# Patient Record
Sex: Female | Born: 1985 | Race: Black or African American | Hispanic: No | Smoking: Never smoker
Health system: Southern US, Community
[De-identification: ages and names within clinical notes are randomized; demographics above are authoritative.]

## PROBLEM LIST (undated history)

## (undated) DIAGNOSIS — G40909 Epilepsy, unspecified, not intractable, without status epilepticus: Secondary | ICD-10-CM

## (undated) DIAGNOSIS — F319 Bipolar disorder, unspecified: Secondary | ICD-10-CM

## (undated) HISTORY — DX: Epilepsy, unspecified, not intractable, without status epilepticus: G40.909

## (undated) HISTORY — DX: Bipolar disorder, unspecified: F31.9

---
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
## 2008-09-12 ENCOUNTER — Emergency Department: Payer: Self-pay

## 2009-06-22 IMAGING — CR RIGHT ANKLE - COMPLETE 3+ VIEW
1 series · 5 of 5 positions shown · non-contrast
Comparison: none

REASON FOR EXAM: Pain, swelling
COMMENTS:   LMP: Three weeks ago

PROCEDURE:     DXR - DXR ANKLE RIGHT COMPLETE  - September 12, 2008  [DATE]
RESULT:     Soft tissue swelling is appreciated within the medial and
lateral malleolar regions. There does not appear to be evidence of fracture
or dislocation.

[Series 1: view not recorded · 0.17mm/px · 5 of 5 slices shown]
[im 1/5]
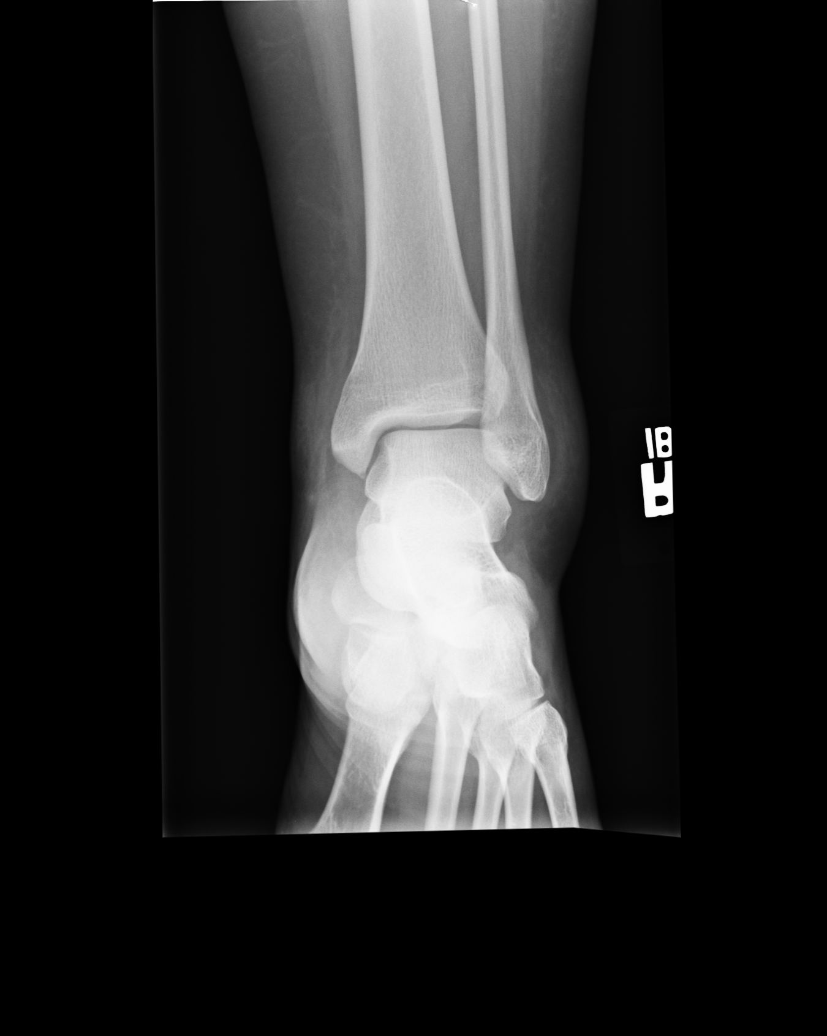
[im 2/5]
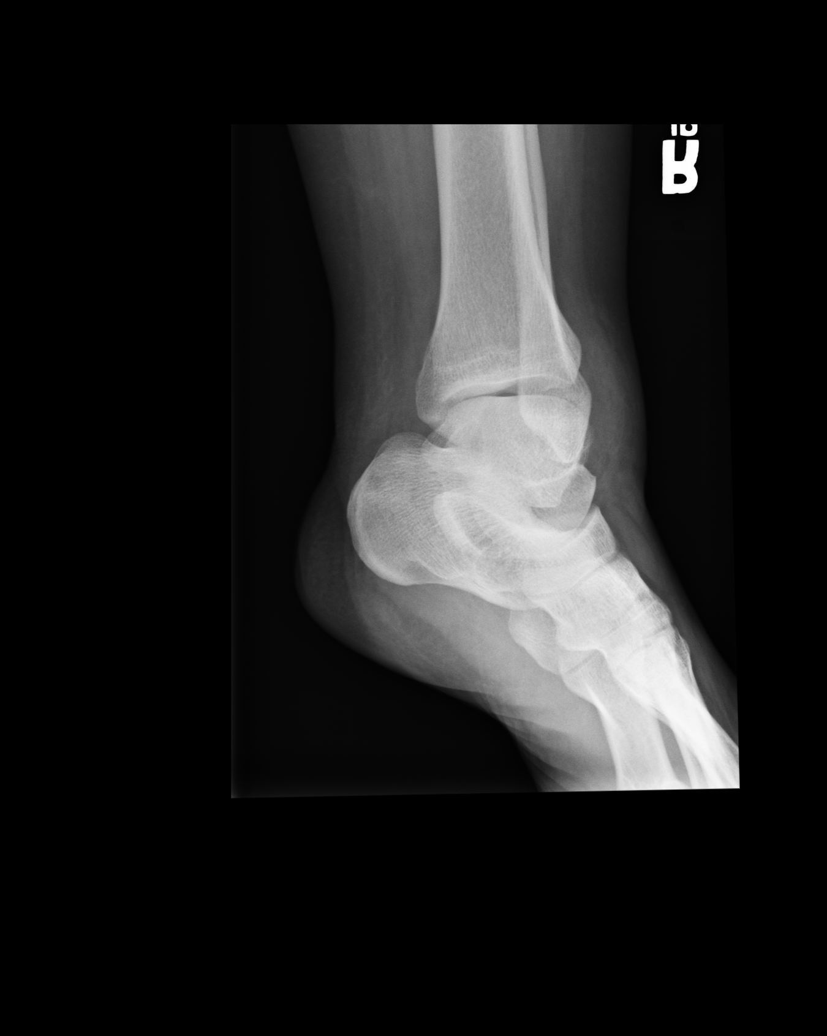
[im 3/5]
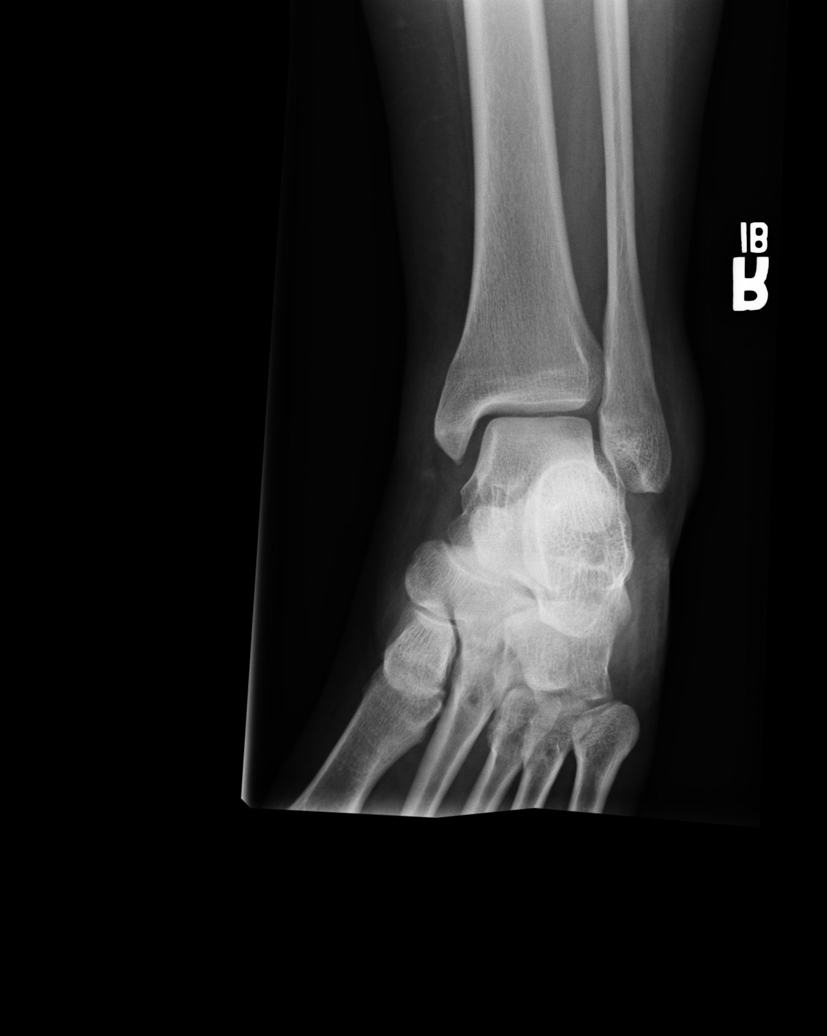
[im 4/5]
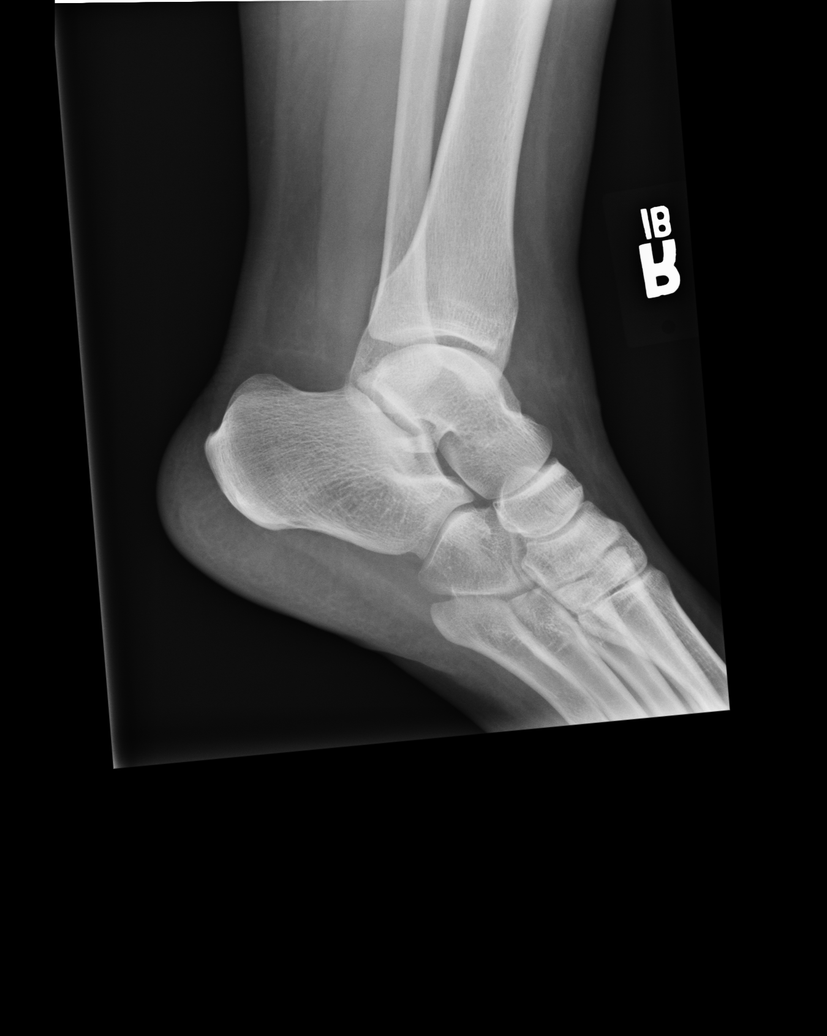
[im 5/5]
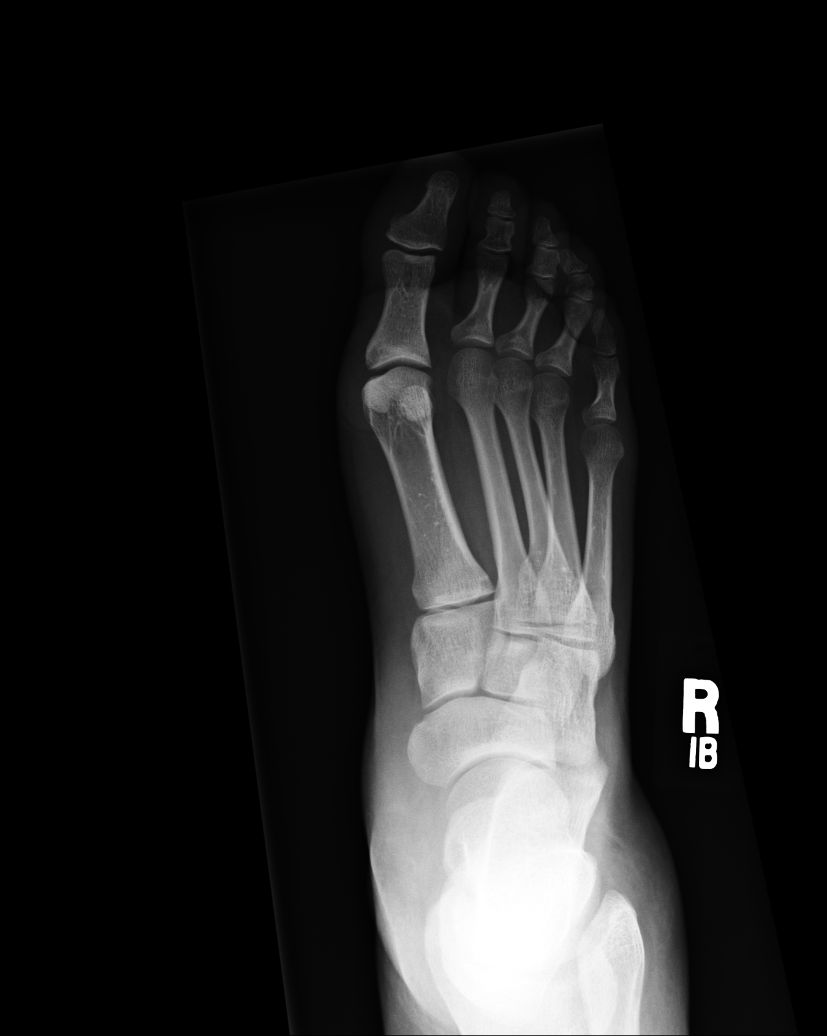

[5 of 5 positions shown; findings below may reference images not displayed]

IMPRESSION: Findings which may reflect the sequelae of ligamentous
injury within the medial and lateral malleolar regions without evidence of
acute osseous abnormalities.

## 2015-07-03 DIAGNOSIS — I10 Essential (primary) hypertension: Secondary | ICD-10-CM | POA: Insufficient documentation

## 2015-07-03 DIAGNOSIS — M0800 Unspecified juvenile rheumatoid arthritis of unspecified site: Secondary | ICD-10-CM | POA: Insufficient documentation

## 2022-04-13 ENCOUNTER — Ambulatory Visit (INDEPENDENT_AMBULATORY_CARE_PROVIDER_SITE_OTHER): Payer: Self-pay | Admitting: Neurology

## 2022-04-13 ENCOUNTER — Other Ambulatory Visit: Payer: Self-pay | Admitting: Neurology

## 2022-04-13 ENCOUNTER — Encounter: Payer: Self-pay | Admitting: Neurology

## 2022-04-13 VITALS — BP 132/84 | HR 86 | Ht 63.6 in | Wt 288.0 lb

## 2022-04-13 DIAGNOSIS — Z5181 Encounter for therapeutic drug level monitoring: Secondary | ICD-10-CM

## 2022-04-13 DIAGNOSIS — G40909 Epilepsy, unspecified, not intractable, without status epilepticus: Secondary | ICD-10-CM

## 2022-04-13 MED ORDER — LEVETIRACETAM ER 1000 MG PO TB24: 1000.0000 mg | ORAL_TABLET | Freq: Every evening | ORAL | 4 refills | Status: DC

## 2022-04-14 ENCOUNTER — Other Ambulatory Visit: Payer: Self-pay | Admitting: Neurology

## 2022-04-15 ENCOUNTER — Telehealth: Payer: Self-pay | Admitting: *Deleted

## 2022-04-15 ENCOUNTER — Encounter: Payer: Self-pay | Admitting: *Deleted

## 2022-04-15 LAB — VITAMIN D 25 HYDROXY (VIT D DEFICIENCY, FRACTURES): Vit D, 25-Hydroxy: 19.2 ng/mL — ABNORMAL LOW (ref 30.0–100.0)

## 2022-04-15 LAB — LEVETIRACETAM LEVEL: Levetiracetam Lvl: <2 ug/mL — ABNORMAL LOW (ref 10.0–40.0)

## 2022-04-15 MED ORDER — LEVETIRACETAM ER 500 MG PO TB24: 1000.0000 mg | ORAL_TABLET | Freq: Every day | ORAL | 11 refills | Status: DC

## 2022-04-15 MED ORDER — VITAMIN D (ERGOCALCIFEROL) 1.25 MG (50000 UNIT) PO CAPS: 50000.0000 [IU] | ORAL_CAPSULE | ORAL | 0 refills | Status: AC

## 2022-04-15 NOTE — Telephone Encounter (Signed)
[facility] does not have levetiracetam ER 1000mg . They need a new rx for levetiracetam ER 500mg , 2 tablets at bedtime. Dr. is out of the office. Dr. will authorize the order in his absence.   I called the patient to let her know. She told me she is currently taking two of the 500mg  ER tablets.

## 2022-04-15 NOTE — Progress Notes (Signed)
Please call and advise the patient that the recent Keppra level was low and to take her Keppra as directed. Also inform patient that Vitamin D level is low and that I will send her a prescription for Vitamin D supplement to take weekly for 8 weeks.  Please remind patient to keep any upcoming appointments or tests and to call us with any interim questions, concerns, problems or updates. Thanks,   Windell Norfolk, MD

## 2022-04-15 NOTE — Telephone Encounter (Addendum)
Pt verified by name and DOB, results given per provider, pt voiced understanding all question answered. 

## 2022-04-15 NOTE — Addendum Note (Signed)
Addended byWindell Norfolk on: 04/15/2022 02:19 PM   Modules accepted: Orders

## 2022-04-15 NOTE — Telephone Encounter (Signed)
-----   Message from Windell Norfolk, MD sent at 04/15/2022  2:18 PM EDT ----- Please call and advise the patient that the recent Keppra level was low and to take her Keppra as directed. Also inform patient that Vitamin D level is low and that I will send her a prescription for Vitamin D supplement to take weekly for 8 weeks.  Please remind patient to keep any upcoming appointments or tests and to call us with any interim questions, concerns, problems or updates. Thanks,   Windell Norfolk, MD

## 2022-05-17 ENCOUNTER — Other Ambulatory Visit: Payer: Self-pay | Admitting: Neurology

## 2022-08-10 ENCOUNTER — Encounter: Payer: Self-pay | Admitting: Neurology

## 2022-10-13 ENCOUNTER — Ambulatory Visit: Payer: Self-pay | Admitting: Neurology

## 2023-02-16 ENCOUNTER — Encounter: Payer: Self-pay | Admitting: Neurology

## 2023-02-16 ENCOUNTER — Ambulatory Visit: Payer: 59 | Admitting: Neurology

## 2023-02-16 VITALS — BP 154/92 | HR 83 | Ht 63.5 in | Wt 319.0 lb

## 2023-02-16 DIAGNOSIS — G40909 Epilepsy, unspecified, not intractable, without status epilepticus: Secondary | ICD-10-CM

## 2023-02-16 MED ORDER — LEVETIRACETAM ER 500 MG PO TB24: 1000.0000 mg | ORAL_TABLET | Freq: Every day | ORAL | 3 refills | Status: DC

## 2023-02-16 NOTE — Patient Instructions (Signed)
Continue with Keppra XR 1000 mg daily Continue your other medications Routine EEG Follow-up with NP  in 1 year or sooner if worse.

## 2023-02-16 NOTE — Progress Notes (Signed)
[facility]  PATIENT: Kristin Villarreal DOB: 1986-01-12  REQUESTING CLINICIAN: No ref. provider found HISTORY FROM: Patient  REASON FOR VISIT: Epilepsy, here to establish care    HISTORICAL  CHIEF COMPLAINT:  Chief Complaint  Patient presents with   Follow-up    Rm13 alone Sz:feb 2022 was the last one, pt wants new EEG   INTERVAL HISTORY 02/16/2023:  Kristin Villarreal presents today for follow-up, she is alone.  Last visit was in June of last year, since then she has been doing well no seizure or seizure-like activity.  She is compliant with the Keppra, denies any side effect but for now a few weeks ago.  Likely for her no breakthrough seizure.  No other complaints  HISTORY OF PRESENT ILLNESS:  This is a 37 year old woman past medical history of epilepsy, bipolar disorder who is presenting to establish care.  Patient reports being diagnosed with epilepsy while in kindergarten, describes her seizures as staring spells.  She was put on Depakote and did well until 2008 when she was put off Depakote because she was seizure-free.  She reported in 2021 she was trying to get her health together, had an EEG and was told that she was still having seizures, she was put back on Keppra, initially started had side effect of somnolence therefore she was switched to extended version.  She reported while being on Keppra she did have 2 subsequent seizures but this was in the setting of alcohol use and possibly forgetting to take her medication.  Her last seizure was reported October 2021.  Denies any side effect of the medication.   She is also on lamotrigine 25 mg daily for bipolar disorder, stated she restarted the medication 2 weeks ago.    Handedness: Right handed   Onset: In Kindergarten   Seizure Type: Staring spell,   Current frequency: Last seizure was October 2021  Any injuries from seizures: None   Seizure risk factors: None   Previous ASMs: Depakote  Currenty ASMs:  Levetiracetam 1000 mg XR   ASMs side effects: Sleepy   Brain Images: Normal head CT 2017  Previous EEGs: Generalized discharges    OTHER MEDICAL CONDITIONS: Bipolar disorder, Epilepsy   REVIEW OF SYSTEMS: Full 14 system review of systems performed and negative with exception of: as noted in the HPI   ALLERGIES: Allergies  Allergen Reactions   Aspirin Nausea And Vomiting, Rash and Shortness Of Breath    Other Reaction(s): GI Intolerance   Penicillins Hives and Rash   Lactose Intolerance (Gi)    Fish Allergy Rash   Fish-Derived Products Rash    HOME MEDICATIONS: Outpatient Medications Prior to Visit  Medication Sig Dispense Refill   lamoTRIgine (LAMICTAL) 25 MG tablet Take 25 mg by mouth daily.     Vitamin D, Ergocalciferol, (DRISDOL) 1.25 MG (50000 UNIT) CAPS capsule Take 1 capsule (50,000 Units total) by mouth every 7 (seven) days. 8 capsule 0   levETIRAcetam (KEPPRA XR) 500 MG 24 hr tablet Take 2 tablets (1,000 mg total) by mouth at bedtime. 60 tablet 11   No facility-administered medications prior to visit.    PAST MEDICAL HISTORY: Past Medical History:  Diagnosis Date   Bipolar 1 disorder (HCC)    Epilepsy (HCC)     PAST SURGICAL HISTORY: History reviewed. No pertinent surgical history.  FAMILY HISTORY: Family History  Problem Relation Age of Onset   Hypertension Mother    Breast cancer Maternal Grandmother    Diabetes Maternal Grandmother  Diabetes Maternal Grandfather    Hypertension Maternal Grandfather     SOCIAL HISTORY: Social History   Socioeconomic History   Marital status: Single    Spouse name: Not on file   Number of children: Not on file   Years of education: Not on file   Highest education level: Not on file  Occupational History   Not on file  Tobacco Use   Smoking status: Never   Smokeless tobacco: Never  Substance and Sexual Activity   Alcohol use: Yes    Comment: socially   Drug use: Not Currently   Sexual activity: Not on  file  Other Topics Concern   Not on file  Social History Narrative   Not on file   Social Determinants of Health   Financial Resource Strain: Not on file  Food Insecurity: Not on file  Transportation Needs: Not on file  Physical Activity: Not on file  Stress: Not on file  Social Connections: Not on file  Intimate Partner Violence: Not on file    PHYSICAL EXAM  GENERAL EXAM/CONSTITUTIONAL: Vitals:  Vitals:   02/16/23 0857  BP: (!) 154/92  Pulse: 83  Weight: (!) 319 lb (144.7 kg)  Height: 5' 3.5" (1.613 m)   Body mass index is 55.62 kg/m. Wt Readings from Last 3 Encounters:  02/16/23 (!) 319 lb (144.7 kg)  04/13/22 288 lb (130.6 kg)   Patient is in no distress; well developed, nourished and groomed; neck is supple  MUSCULOSKELETAL: Gait, strength, tone, movements noted in Neurologic exam below  NEUROLOGIC: MENTAL STATUS:      No data to display         awake, alert, oriented to person, place and time recent and remote memory intact normal attention and concentration language fluent, comprehension intact, naming intact fund of knowledge appropriate  CRANIAL NERVE:  2nd, 3rd, 4th, 6th - pupils equal and reactive to light, visual fields full to confrontation, extraocular muscles intact, no nystagmus 5th - facial sensation symmetric 7th - facial strength symmetric 8th - hearing intact 9th - palate elevates symmetrically, uvula midline 11th - shoulder shrug symmetric 12th - tongue protrusion midline  MOTOR:  normal bulk and tone, full strength in the BUE, BLE  SENSORY:  normal and symmetric to light touch, pinprick, temperature, vibration  COORDINATION:  finger-nose-finger, fine finger movements normal  GAIT/STATION:  Normal   DIAGNOSTIC DATA (LABS, IMAGING, TESTING) - I reviewed patient records, labs, notes, testing and imaging myself where available.  No results found for: "WBC", "HGB", "HCT", "MCV", "PLT" No results found for: "NA", "K",  "CL", "CO2", "GLUCOSE", "BUN", "CREATININE", "CALCIUM", "PROT", "ALBUMIN", "AST", "ALT", "ALKPHOS", "BILITOT", "GFRNONAA", "GFRAA" No results found for: "CHOL", "HDL", "LDLCALC", "LDLDIRECT", "TRIG" No results found for: "HGBA1C" No results found for: "VITAMINB12" No results found for: "TSH"   Normal head CT 2017  EEG 2020 1) Generalized epileptiform discharges as described. Occasional, generalized, bifrontally predominant spike and wave discharges were present at a frequency of 3 hertz.  The average duration was approximately 2 seconds. The longest duration of discharge was approximately 4 seconds.  CLINICAL CORRELATION:This EEG is indicative of a lowered seizure threshold with a propensity toward primary generalized seizures.     ASSESSMENT AND PLAN  37 y.o. year old female  with history of epilepsy and bipolar disorder who is presenting for follow-up, no seizure or seizure activity since last visit.  She is compliant with the Keppra XR 1000 mg daily but ran out a few weeks ago.  Will  refill the Keppra, will give patient a year worth of supply.  Will also repeat a routine EEG for background classification.  Patient understands to contact us if she has a breakthrough seizure.  Follow-up in 1 year, can follow-up with NP  1. Seizure disorder [facility])      Patient Instructions  Continue with Keppra XR 1000 mg daily Continue your other medications Routine EEG Follow-up with NP  in 1 year or sooner if worse.   Per [facility] statutes, patients with seizures are not allowed to drive until they have been seizure-free for six months.  Other recommendations include using caution when using heavy equipment or power tools. Avoid working on ladders or at heights. Take showers instead of baths.  Do not swim alone.  Ensure the water temperature is not too high on the home water heater. Do not go swimming alone. Do not lock yourself in a room alone (i.e. bathroom). When caring for infants or  small children, sit down when holding, feeding, or changing them to minimize risk of injury to the child in the event you have a seizure. Maintain good sleep hygiene. Avoid alcohol.  Also recommend adequate sleep, hydration, good diet and minimize stress.   During the Seizure  - First, ensure adequate ventilation and place patients on the floor on their left side  Loosen clothing around the neck and ensure the airway is patent. If the patient is clenching the teeth, do not force the mouth open with any object as this can cause severe damage - Remove all items from the surrounding that can be hazardous. The patient may be oblivious to what's happening and may not even know what he or she is doing. If the patient is confused and wandering, either gently guide him/her away and block access to outside areas - Reassure the individual and be comforting - Call 911. In most cases, the seizure ends before EMS arrives. However, there are cases when seizures may last over 3 to 5 minutes. Or the individual may have developed breathing difficulties or severe injuries. If a pregnant patient or a person with diabetes develops a seizure, it is prudent to call an ambulance. - Finally, if the patient does not regain full consciousness, then call EMS. Most patients will remain confused for about 45 to 90 minutes after a seizure, so you must use judgment in calling for help. - Avoid restraints but make sure the patient is in a bed with padded side rails - Place the individual in a lateral position with the neck slightly flexed; this will help the saliva drain from the mouth and prevent the tongue from falling backward - Remove all nearby furniture and other hazards from the area - Provide verbal assurance as the individual is regaining consciousness - Provide the patient with privacy if possible - Call for help and start treatment as ordered by the caregiver   After the Seizure (Postictal Stage)  After a seizure,  most patients experience confusion, fatigue, muscle pain and/or a headache. Thus, one should permit the individual to sleep. For the next few days, reassurance is essential. Being calm and helping reorient the person is also of importance.  Most seizures are painless and end spontaneously. Seizures are not harmful to others but can lead to complications such as stress on the lungs, brain and the heart. Individuals with prior lung problems may develop labored breathing and respiratory distress.     Orders Placed This Encounter  Procedures   EEG adult  Meds ordered this encounter  Medications   levETIRAcetam (KEPPRA XR) 500 MG 24 hr tablet    Sig: Take 2 tablets (1,000 mg total) by mouth at bedtime.    Dispense:  180 tablet    Refill:  3    Rx to replace the levetiracetam XR 1000mg  that is unavailable.    Return in about 1 year (around 02/16/2024).    Windell Norfolk, MD 02/16/2023, 9:13 AM  [facility] [address] [place], [place] [address] [phone]

## 2023-03-02 ENCOUNTER — Other Ambulatory Visit: Payer: 59 | Admitting: *Deleted

## 2023-03-02 ENCOUNTER — Encounter: Payer: Self-pay | Admitting: *Deleted

## 2023-06-21 ENCOUNTER — Other Ambulatory Visit: Payer: Self-pay

## 2023-06-21 ENCOUNTER — Emergency Department (HOSPITAL_COMMUNITY)
Admission: EM | Admit: 2023-06-21 | Discharge: 2023-06-21 | Disposition: A | Discharge status: Home / Self Care | Payer: 59 | Attending: Emergency Medicine | Admitting: Emergency Medicine

## 2023-06-21 ENCOUNTER — Encounter (HOSPITAL_COMMUNITY): Payer: Self-pay

## 2023-06-21 DIAGNOSIS — J020 Streptococcal pharyngitis: Secondary | ICD-10-CM | POA: Insufficient documentation

## 2023-06-21 DIAGNOSIS — J029 Acute pharyngitis, unspecified: Secondary | ICD-10-CM | POA: Diagnosis present

## 2023-06-21 LAB — GROUP A STREP BY PCR: Group A Strep by PCR: DETECTED — AB

## 2023-06-21 MED ORDER — AZITHROMYCIN 250 MG PO TABS: 250.0000 mg | ORAL_TABLET | Freq: Every day | ORAL | 0 refills | Status: AC

## 2023-06-21 MED ORDER — AZITHROMYCIN 250 MG PO TABS
500.0000 mg | ORAL_TABLET | Freq: Once | ORAL | Status: AC
  Administered 2023-06-21: 500 mg via ORAL
  Filled 2023-06-21: qty 2

## 2023-06-21 MED ORDER — DEXAMETHASONE 4 MG PO TABS
10.0000 mg | ORAL_TABLET | Freq: Once | ORAL | Status: AC
  Administered 2023-06-21: 10 mg via ORAL
  Filled 2023-06-21: qty 1

## 2023-06-21 NOTE — Discharge Instructions (Signed)
You were seen in the ER today for a sore throat. You were positive for Group A strep. I have started you on Azithromycin to treat this. You were also given a dose of Decadron to help alleviate your sore throat. Manage your symptoms at home with over the counter medications as best needed.

## 2023-06-21 NOTE — ED Provider Notes (Signed)
[place] EMERGENCY DEPARTMENT AT [facility] Provider Note   CSN: [id number] Arrival date & time: 06/21/23  1929     History Chief Complaint  Patient presents with   Sore Throat    Kristin Villarreal is a 37 y.o. female.  Patient without significant past medical history presents the emergency department concerns of a sore throat.  Reports that she has had this sore throat for several days and reports she gets strep pharyngitis multiple times per year.  No prior history of ENT evaluation for this.  Denies any sick contacts at home.  Endorses mild headache, subjective fever and chills, and difficulty swallowing due to pain.  Has not taken any medications for treatment of this.   Sore Throat       Home Medications Prior to Admission medications   Medication Sig Start Date End Date Taking? Authorizing Provider  azithromycin (ZITHROMAX) 250 MG tablet Take 1 tablet (250 mg total) by mouth daily for 4 days. Take first 2 tablets together, then 1 every day until finished. 06/21/23 06/25/23 Yes Smitty Knudsen, PA-C  lamoTRIgine (LAMICTAL) 25 MG tablet Take 25 mg by mouth daily. 03/25/22   [provider]  levETIRAcetam (KEPPRA XR) 500 MG 24 hr tablet Take 2 tablets (1,000 mg total) by mouth at bedtime. 02/16/23 02/11/24  Windell Norfolk, MD  Vitamin D, Ergocalciferol, (DRISDOL) 1.25 MG (50000 UNIT) CAPS capsule Take 1 capsule (50,000 Units total) by mouth every 7 (seven) days. 04/15/22   Windell Norfolk, MD      Allergies    Aspirin, Penicillins, Lactose intolerance (gi), Fish allergy, and Fish-derived products    Review of Systems   Review of Systems  HENT:  Positive for sore throat.   All other systems reviewed and are negative.   Physical Exam Updated Vital Signs BP (!) 169/95   Pulse (!) 110   Temp 98.4 F (36.9 C) (Oral)   Resp 18   Ht 5' 3.5" (1.613 m)   Wt (!) 144.7 kg   SpO2 98%   BMI 55.62 kg/m  Physical Exam Vitals and nursing note reviewed.   Constitutional:      General: She is not in acute distress.    Appearance: She is well-developed.  HENT:     Head: Normocephalic and atraumatic.     Mouth/Throat:     Tonsils: Tonsillar exudate present. No tonsillar abscesses. 2+ on the right. 2+ on the left.  Eyes:     Conjunctiva/sclera: Conjunctivae normal.  Cardiovascular:     Rate and Rhythm: Normal rate and regular rhythm.     Heart sounds: No murmur heard. Pulmonary:     Effort: Pulmonary effort is normal. No respiratory distress.     Breath sounds: Normal breath sounds.  Abdominal:     Palpations: Abdomen is soft.     Tenderness: There is no abdominal tenderness.  Musculoskeletal:        General: No swelling.     Cervical back: Neck supple.  Skin:    General: Skin is warm and dry.     Capillary Refill: Capillary refill takes less than 2 seconds.  Neurological:     Mental Status: She is alert.  Psychiatric:        Mood and Affect: Mood normal.     ED Results / Procedures / Treatments   Labs (all labs ordered are listed, but only abnormal results are displayed) Labs Reviewed  GROUP A STREP BY PCR - Abnormal; Notable for the following  components:      Result Value   Group A Strep by PCR DETECTED (*)    All other components within normal limits    EKG None  Radiology No results found.  Procedures Procedures   Medications Ordered in ED Medications  dexamethasone (DECADRON) tablet 10 mg (10 mg Oral Given 06/21/23 2122)  azithromycin (ZITHROMAX) tablet 500 mg (500 mg Oral Given 06/21/23 2122)    ED Course/ Medical Decision Making/ A&P                               Medical Decision Making Risk Prescription drug management.   This patient presents to the ED for concern of sore throat.  Differential diagnosis includes strep pharyngitis, viral URI, COVID-19, pneumonia, bronchitis   Lab Tests:  I Ordered, and personally interpreted labs.  The pertinent results include: Group A strep  positive    Medicines ordered and prescription drug management:  I ordered medication including azithromycin, Decadron for strep pharyngitis, sore throat Reevaluation of the patient after these medicines showed that the patient improved I have reviewed the patients home medicines and have made adjustments as needed   Problem List / ED Course:  Patient presented to the ED with concerns of a sore throat. States that this feels like strep pharyngitis and typically gets strep throat 3-4 times per year. Denies any recent sick contacts at home or work. Pain with swallowing but denies difficult swallowing. Tonsils with erythema and exudate but no significant swelling. Group A strep is positive. Patient with PCN allergy. Will treat with azithromycin and decadron. Dose given here in the ED of Zithromax and Decadron. Prescription for Zithromax sent to patient's pharmacy. Low concern at this time for PTA or RPA as there is no uvular deviation or unilateral swelling or dysphagia. Discussed strict return precautions. Encouraged outpatient follow up with PCP. No other acute concerns at this time. Patient discharged home in stable condition.  Final Clinical Impression(s) / ED Diagnoses Final diagnoses:  Strep pharyngitis    Rx / DC Orders ED Discharge Orders          Ordered    azithromycin (ZITHROMAX) 250 MG tablet  Daily        06/21/23 2109              Smitty Knudsen, PA-C 06/21/23 2344    Glyn Ade, MD 06/22/23 1459

## 2023-06-21 NOTE — ED Triage Notes (Signed)
Pt states that she feels like she has strept throat, pt reports getting it every year. Pt complains of swollen sore throat, fever, and chills. Pt has a swollen throat with white secretions.

## 2024-02-22 ENCOUNTER — Encounter: Payer: Self-pay | Admitting: Neurology

## 2024-02-22 ENCOUNTER — Ambulatory Visit: Payer: 59 | Admitting: Neurology

## 2024-02-22 ENCOUNTER — Telehealth: Payer: Self-pay | Admitting: Neurology

## 2024-02-22 NOTE — Progress Notes (Deleted)
 Patient: Kristin Villarreal Date of Birth: May 24, 1986  Reason for Visit: Follow up History from: Patient Primary Neurologist: Camara   ASSESSMENT AND PLAN 38 y.o. year old female   1.  Seizure disorder   HISTORY OF PRESENT ILLNESS: Today 02/22/24 Update 02/22/24 SS:   HISTORY  INTERVAL HISTORY 02/16/2023:  Kristin Villarreal presents today for follow-up, she is alone.  Last visit was in June of last year, since then she has been doing well no seizure or seizure-like activity.  She is compliant with the Keppra , denies any side effect but for now a few weeks ago.  Likely for her no breakthrough seizure.  No other complaints   HISTORY OF PRESENT ILLNESS:  This is a 38 year old woman past medical history of epilepsy, bipolar disorder who is presenting to establish care.  Patient reports being diagnosed with epilepsy while in kindergarten, describes her seizures as staring spells.  She was put on Depakote and did well until 2008 when she was put off Depakote because she was seizure-free.  She reported in 2021 she was trying to get her health together, had an EEG and was told that she was still having seizures, she was put back on Keppra , initially started had side effect of somnolence therefore she was switched to extended version.  She reported while being on Keppra  she did have 2 subsequent seizures but this was in the setting of alcohol use and possibly forgetting to take her medication.  Her last seizure was reported October 2021.  Denies any side effect of the medication.   She is also on lamotrigine 25 mg daily for bipolar disorder, stated she restarted the medication 2 weeks ago.     Handedness: Right handed    Onset: In Kindergarten    Seizure Type: Staring spell,    Current frequency: Last seizure was October 2021   Any injuries from seizures: None    Seizure risk factors: None    Previous ASMs: Depakote   Currenty ASMs: Levetiracetam  1000 mg XR    ASMs side effects: Sleepy     Brain Images: Normal head CT 2017   Previous EEGs: Generalized discharges    REVIEW OF SYSTEMS: Out of a complete 14 system review of symptoms, the patient complains only of the following symptoms, and all other reviewed systems are negative.  See HPI  ALLERGIES: Allergies  Allergen Reactions   Aspirin Nausea And Vomiting, Rash and Shortness Of Breath    Other Reaction(s): GI Intolerance   Penicillins Hives and Rash   Lactose Intolerance (Gi)    Fish Allergy Rash   Fish-Derived Products Rash    HOME MEDICATIONS: Outpatient Medications Prior to Visit  Medication Sig Dispense Refill   lamoTRIgine (LAMICTAL) 25 MG tablet Take 25 mg by mouth daily.     levETIRAcetam  (KEPPRA  XR) 500 MG 24 hr tablet Take 2 tablets (1,000 mg total) by mouth at bedtime. 180 tablet 3   Vitamin D , Ergocalciferol , (DRISDOL ) 1.25 MG (50000 UNIT) CAPS capsule Take 1 capsule (50,000 Units total) by mouth every 7 (seven) days. 8 capsule 0   No facility-administered medications prior to visit.    PAST MEDICAL HISTORY: Past Medical History:  Diagnosis Date   Bipolar 1 disorder (HCC)    Epilepsy (HCC)     PAST SURGICAL HISTORY: No past surgical history on file.  FAMILY HISTORY: Family History  Problem Relation Age of Onset   Hypertension Mother    Breast cancer Maternal Grandmother    Diabetes Maternal Grandmother  Diabetes Maternal Grandfather    Hypertension Maternal Grandfather     SOCIAL HISTORY: Social History   Socioeconomic History   Marital status: Legally Separated    Spouse name: Not on file   Number of children: Not on file   Years of education: Not on file   Highest education level: Not on file  Occupational History   Not on file  Tobacco Use   Smoking status: Never   Smokeless tobacco: Never  Substance and Sexual Activity   Alcohol use: Yes    Comment: socially   Drug use: Not Currently   Sexual activity: Not on file  Other Topics Concern   Not on file  Social  History Narrative   Not on file   Social Drivers of Health   Financial Resource Strain: Low Risk  (12/27/2023)   Received from [facility]   Overall Financial Resource Strain (CARDIA)    Difficulty of Paying Living Expenses: Not hard at all  Food Insecurity: No Food Insecurity (12/27/2023)   Received from [facility]   Hunger Vital Sign    Worried About Running Out of Food in the Last Year: Never true    Ran Out of Food in the Last Year: Never true  Transportation Needs: No Transportation Needs (12/27/2023)   Received from [facility] - Transportation    Lack of Transportation (Medical): No    Lack of Transportation (Non-Medical): No  Physical Activity: Insufficiently Active (12/27/2023)   Received from [facility]   Exercise Vital Sign    Days of Exercise per Week: 2 days    Minutes of Exercise per Session: 20 min  Stress: No Stress Concern Present (12/27/2023)   Received from [facility] - Occupational Stress Questionnaire    Feeling of Stress : Not at all  Social Connections: Socially Integrated (12/27/2023)   Received from [facility]   Social Network    How would you rate your social network (family, work, friends)?: Good participation with social networks  Intimate Partner Violence: Not At Risk (12/27/2023)   Received from [facility]   HITS    Over the last 12 months how often did your partner physically hurt you?: Never    Over the last 12 months how often did your partner insult you or talk down to you?: Never    Over the last 12 months how often did your partner threaten you with physical harm?: Never    Over the last 12 months how often did your partner scream or curse at you?: Never    PHYSICAL EXAM  There were no vitals filed for this visit. There is no height or weight on file to calculate BMI.  Generalized: Well developed, in no acute distress  Neurological examination  Mentation: Alert oriented  to time, place, history taking. Follows all commands speech and language fluent Cranial nerve II-XII: Pupils were equal round reactive to light. Extraocular movements were full, visual field were full on confrontational test. Facial sensation and strength were normal. Uvula tongue midline. Head turning and shoulder shrug  were normal and symmetric. Motor: The motor testing reveals 5 over 5 strength of all 4 extremities. Good symmetric motor tone is noted throughout.  Sensory: Sensory testing is intact to soft touch on all 4 extremities. No evidence of extinction is noted.  Coordination: Cerebellar testing reveals good finger-nose-finger and heel-to-shin bilaterally.  Gait and station: Gait is normal. Tandem gait is normal. Romberg is  negative. No drift is seen.  Reflexes: Deep tendon reflexes are symmetric and normal bilaterally.   DIAGNOSTIC DATA (LABS, IMAGING, TESTING) - I reviewed patient records, labs, notes, testing and imaging myself where available.  No results found for: "WBC", "HGB", "HCT", "MCV", "PLT" No results found for: "NA", "K", "CL", "CO2", "GLUCOSE", "BUN", "CREATININE", "CALCIUM", "PROT", "ALBUMIN", "AST", "ALT", "ALKPHOS", "BILITOT", "GFRNONAA", "GFRAA" No results found for: "CHOL", "HDL", "LDLCALC", "LDLDIRECT", "TRIG", "CHOLHDL" No results found for: "HGBA1C" No results found for: "VITAMINB12" No results found for: "TSH"  Jeanmarie Millet, AGNP-C, DNP 02/22/2024, 5:31 AM [facility] [address], [address] [place], [place] [address] [phone]

## 2024-02-22 NOTE — Telephone Encounter (Signed)
 Pt will not make today's appointment, she will call back to r/s

## 2024-10-11 ENCOUNTER — Emergency Department (HOSPITAL_COMMUNITY)
Admission: EM | Admit: 2024-10-11 | Discharge: 2024-10-11 | Discharge status: Against Medical Advice | Attending: Emergency Medicine | Admitting: Emergency Medicine

## 2024-10-11 ENCOUNTER — Other Ambulatory Visit: Payer: Self-pay

## 2024-10-11 ENCOUNTER — Encounter (HOSPITAL_COMMUNITY): Payer: Self-pay | Admitting: Emergency Medicine

## 2024-10-11 DIAGNOSIS — Z5321 Procedure and treatment not carried out due to patient leaving prior to being seen by health care provider: Secondary | ICD-10-CM | POA: Diagnosis not present

## 2024-10-11 DIAGNOSIS — J101 Influenza due to other identified influenza virus with other respiratory manifestations: Secondary | ICD-10-CM | POA: Insufficient documentation

## 2024-10-11 DIAGNOSIS — R509 Fever, unspecified: Secondary | ICD-10-CM | POA: Diagnosis present

## 2024-10-11 LAB — RESP PANEL BY RT-PCR (RSV, FLU A&B, COVID)  RVPGX2
Influenza A by PCR: POSITIVE — AB
Influenza B by PCR: NEGATIVE
Resp Syncytial Virus by PCR: NEGATIVE
SARS Coronavirus 2 by RT PCR: NEGATIVE

## 2024-10-11 MED ORDER — ACETAMINOPHEN 500 MG PO TABS
1000.0000 mg | ORAL_TABLET | Freq: Once | ORAL | Status: AC
  Administered 2024-10-11: 1000 mg via ORAL
  Filled 2024-10-11: qty 2

## 2024-10-11 NOTE — ED Triage Notes (Signed)
 Pt to er, pt states that she is here for a fever, cough and runny nose and general body aches, states that she has been feeling poorly for the last two days.

## 2024-10-11 NOTE — ED Notes (Signed)
 Called pt x3 to obtain new set of v/s, no answer.  KM

## 2024-10-17 ENCOUNTER — Other Ambulatory Visit: Payer: Self-pay | Admitting: Neurology

## 2024-10-17 ENCOUNTER — Telehealth: Payer: Self-pay | Admitting: Neurology

## 2024-10-17 ENCOUNTER — Telehealth: Payer: Self-pay

## 2024-10-17 ENCOUNTER — Other Ambulatory Visit: Payer: Self-pay

## 2024-10-17 MED ORDER — LEVETIRACETAM ER 500 MG PO TB24: 1000.0000 mg | ORAL_TABLET | Freq: Every day | ORAL | 3 refills | Status: DC

## 2024-10-17 NOTE — Telephone Encounter (Signed)
 Keppra  refilled, patient has a follow up with me on 2/17

## 2024-10-17 NOTE — Telephone Encounter (Signed)
 Pt is needing a refill on her levETIRAcetam  (KEPPRA  XR) 500 MG 24 hr tablet (Expired) and is needing it sent to the [facility] on [address]

## 2024-10-17 NOTE — Telephone Encounter (Signed)
 RECEIVED REFILL REQUEST FOR KEPPRA  HOWEVER PT IS OVER DUE TO BE SEEN. WILL ROUTE TO DR. GREGG AS IT WILL NOT ALLOW ME TO REORDER AND ALSO TO PHONE ROOM TO SCHEDULED APPT

## 2024-10-22 ENCOUNTER — Encounter (HOSPITAL_COMMUNITY): Payer: Self-pay | Admitting: Emergency Medicine

## 2024-10-22 ENCOUNTER — Other Ambulatory Visit: Payer: Self-pay

## 2024-10-22 ENCOUNTER — Emergency Department (HOSPITAL_COMMUNITY)
Admission: EM | Admit: 2024-10-22 | Discharge: 2024-10-23 | Disposition: A | Discharge status: Home / Self Care | Attending: Emergency Medicine | Admitting: Emergency Medicine

## 2024-10-22 ENCOUNTER — Emergency Department (HOSPITAL_COMMUNITY)

## 2024-10-22 DIAGNOSIS — M940 Chondrocostal junction syndrome [Tietze]: Secondary | ICD-10-CM | POA: Diagnosis not present

## 2024-10-22 DIAGNOSIS — D72829 Elevated white blood cell count, unspecified: Secondary | ICD-10-CM | POA: Diagnosis not present

## 2024-10-22 DIAGNOSIS — R000 Tachycardia, unspecified: Secondary | ICD-10-CM | POA: Diagnosis not present

## 2024-10-22 DIAGNOSIS — I10 Essential (primary) hypertension: Secondary | ICD-10-CM | POA: Insufficient documentation

## 2024-10-22 DIAGNOSIS — R0781 Pleurodynia: Secondary | ICD-10-CM | POA: Diagnosis present

## 2024-10-22 LAB — CBC
HCT: 36.5 % (ref 36.0–46.0)
Hemoglobin: 11 g/dL — ABNORMAL LOW (ref 12.0–15.0)
MCH: 27.5 pg (ref 26.0–34.0)
MCHC: 30.1 g/dL (ref 30.0–36.0)
MCV: 91.3 fL (ref 80.0–100.0)
Platelets: 474 K/uL — ABNORMAL HIGH (ref 150–400)
RBC: 4 MIL/uL (ref 3.87–5.11)
RDW: 15.9 % — ABNORMAL HIGH (ref 11.5–15.5)
WBC: 13.2 K/uL — ABNORMAL HIGH (ref 4.0–10.5)
nRBC: 0 % (ref 0.0–0.2)

## 2024-10-22 LAB — BASIC METABOLIC PANEL WITH GFR
Anion gap: 11 (ref 5–15)
BUN: 14 mg/dL (ref 6–20)
CO2: 26 mmol/L (ref 22–32)
Calcium: 9.1 mg/dL (ref 8.9–10.3)
Chloride: 102 mmol/L (ref 98–111)
Creatinine, Ser: 0.99 mg/dL (ref 0.44–1.00)
GFR, Estimated: >60 mL/min
Glucose, Bld: 98 mg/dL (ref 70–99)
Potassium: 4.3 mmol/L (ref 3.5–5.1)
Sodium: 138 mmol/L (ref 135–145)

## 2024-10-22 LAB — TROPONIN T, HIGH SENSITIVITY: Troponin T High Sensitivity: <15 ng/L (ref 0–19)

## 2024-10-22 LAB — HCG, SERUM, QUALITATIVE: Preg, Serum: NEGATIVE

## 2024-10-22 NOTE — ED Triage Notes (Signed)
 Pt c/o persistent cough and increase cp for a week. Was diagnose with flu almost 2 weeks ago. Pt continues to have SOB as well.

## 2024-10-23 ENCOUNTER — Emergency Department (HOSPITAL_COMMUNITY)

## 2024-10-23 LAB — TROPONIN T, HIGH SENSITIVITY: Troponin T High Sensitivity: <15 ng/L (ref 0–19)

## 2024-10-23 MED ORDER — PREDNISONE 20 MG PO TABS: 40.0000 mg | ORAL_TABLET | Freq: Every day | ORAL | 0 refills | Status: AC

## 2024-10-23 MED ORDER — ALBUTEROL SULFATE HFA 108 (90 BASE) MCG/ACT IN AERS
2.0000 | INHALATION_SPRAY | Freq: Once | RESPIRATORY_TRACT | Status: AC
  Administered 2024-10-23: 2 via RESPIRATORY_TRACT
  Filled 2024-10-23: qty 6.7

## 2024-10-23 MED ORDER — AEROCHAMBER PLUS FLO-VU MEDIUM MISC
1.0000 | Freq: Once | Status: AC
  Administered 2024-10-23: 1

## 2024-10-23 MED ORDER — OXYCODONE HCL 5 MG PO TABS: 5.0000 mg | ORAL_TABLET | ORAL | 0 refills | Status: AC | PRN

## 2024-10-23 MED ORDER — IOHEXOL 350 MG/ML SOLN
75.0000 mL | Freq: Once | INTRAVENOUS | Status: AC | PRN
  Administered 2024-10-23: 75 mL via INTRAVENOUS

## 2024-10-23 MED ORDER — DEXAMETHASONE SOD PHOSPHATE PF 10 MG/ML IJ SOLN
10.0000 mg | Freq: Once | INTRAMUSCULAR | Status: AC
  Administered 2024-10-23: 10 mg via INTRAVENOUS

## 2024-10-23 MED ORDER — ACETAMINOPHEN 325 MG PO TABS
650.0000 mg | ORAL_TABLET | Freq: Once | ORAL | Status: AC
  Administered 2024-10-23: 650 mg via ORAL
  Filled 2024-10-23: qty 2

## 2024-10-23 MED ORDER — KETOROLAC TROMETHAMINE 15 MG/ML IJ SOLN
15.0000 mg | Freq: Once | INTRAMUSCULAR | Status: AC
  Administered 2024-10-23: 15 mg via INTRAVENOUS
  Filled 2024-10-23: qty 1

## 2024-10-23 NOTE — ED Provider Notes (Signed)
 " [place] [facility] Provider Note   CSN: [id number] Arrival date & time: 10/22/24  2033     Patient presents with: Chest Pain   Kristin Villarreal is a 39 y.o. female with h/o HTN, RA, seizure disorder on Keppra  presents to the ER today for evaluation of bilateral lower rib pain for the past 7-10 days. Does have some pain with breathing. Worse with position changes. Non-radiating. Reports occasional dry cough, but has improved. She was diagnosed with the flu around 2 to 3 weeks ago.  She reports that overall she has felt better just has the rib pain and occasional dry cough.  No longer have any fevers.  No worsening leg swelling or any hemoptysis.  Does feel like she has some shortness of breath secondary to pain.  Has not had a fever since having the flu.  Denies any new trauma.  Chest Pain Associated symptoms: cough   Associated symptoms: no abdominal pain, no fever, no nausea and no vomiting        Prior to Admission medications  Medication Sig Start Date End Date Taking? Authorizing Provider  oxyCODONE  (ROXICODONE ) 5 MG immediate release tablet Take 1 tablet (5 mg total) by mouth every 4 (four) hours as needed for severe pain (pain score 7-10). 10/23/24  Yes Bernis Ernst, PA-C  predniSONE  (DELTASONE ) 20 MG tablet Take 2 tablets (40 mg total) by mouth daily for 5 days. 10/23/24 10/28/24 Yes Bernis Ernst, PA-C  lamoTRIgine (LAMICTAL) 25 MG tablet Take 25 mg by mouth daily. 03/25/22   [provider]  levETIRAcetam  (KEPPRA  XR) 500 MG 24 hr tablet Take 2 tablets (1,000 mg total) by mouth at bedtime. 10/17/24 10/12/25  Camara, Amadou, MD  Vitamin D , Ergocalciferol , (DRISDOL ) 1.25 MG (50000 UNIT) CAPS capsule Take 1 capsule (50,000 Units total) by mouth every 7 (seven) days. 04/15/22   Camara, Amadou, MD    Allergies: Aspirin, Penicillins, Lactose intolerance (gi), Fish allergy, and Fish protein-containing drug products    Review of Systems   Constitutional:  Negative for chills and fever.  HENT:  Negative for congestion and rhinorrhea.   Respiratory:  Positive for cough.   Cardiovascular:  Positive for chest pain.  Gastrointestinal:  Negative for abdominal pain, constipation, diarrhea, nausea and vomiting.    Updated Vital Signs BP (!) 147/97 (BP Location: Right Arm)   Pulse (!) 101   Temp 98 F (36.7 C)   Resp 16   Ht 5' 4 (1.626 m)   Wt (!) 147.4 kg   LMP 09/26/2024 (Exact Date)   SpO2 95%   BMI 55.79 kg/m   Physical Exam Vitals and nursing note reviewed.  Constitutional:      General: She is not in acute distress.    Appearance: She is not ill-appearing or toxic-appearing.  Cardiovascular:     Rate and Rhythm: Tachycardia present.     Comments: Mild tachycardia Pulmonary:     Effort: Pulmonary effort is normal. No respiratory distress.     Breath sounds: No wheezing.     Comments: Diminished at the bases however difficult secondary to body habitus.  Speaking in full sentences. Chest:     Chest wall: Tenderness present.       Comments: Tenderness to the marked area above.  Some tenderness to the anterior upper chest as well. Abdominal:     Palpations: Abdomen is soft.     Tenderness: There is no abdominal tenderness.  Musculoskeletal:     Right  lower leg: No edema.     Left lower leg: No edema.  Skin:    General: Skin is warm and dry.  Neurological:     Mental Status: She is alert.     (all labs ordered are listed, but only abnormal results are displayed) Labs Reviewed  CBC - Abnormal; Notable for the following components:      Result Value   WBC 13.2 (*)    Hemoglobin 11.0 (*)    RDW 15.9 (*)    Platelets 474 (*)    All other components within normal limits  BASIC METABOLIC PANEL WITH GFR  HCG, SERUM, QUALITATIVE  TROPONIN T, HIGH SENSITIVITY  TROPONIN T, HIGH SENSITIVITY    EKG: EKG Interpretation Date/Time:  Monday October 22 2024 21:48:27 EST Ventricular Rate:  107 PR  Interval:  122 QRS Duration:  70 QT Interval:  336 QTC Calculation: 448 R Axis:   91  Text Interpretation: Sinus tachycardia Rightward axis T wave abnormality, consider inferior ischemia Abnormal ECG No previous ECGs available Confirmed by Bari Pfeiffer ([id number]) on 10/23/2024 1:43:06 AM  Radiology: CT Angio Chest PE W and/or Wo Contrast Result Date: 10/23/2024 EXAM: CTA CHEST 10/23/2024 02:04:19 AM TECHNIQUE: CTA of the chest was performed after the administration of intravenous contrast. Multiplanar reformatted images are provided for review. MIP images are provided for review. Automated exposure control, iterative reconstruction, and/or weight based adjustment of the mA/kV was utilized to reduce the radiation dose to as low as reasonably achievable. COMPARISON: None available. CLINICAL HISTORY: Pulmonary embolism (PE) suspected, high prob FINDINGS: PULMONARY ARTERIES: Pulmonary arteries are adequately opacified for evaluation. No acute pulmonary embolus. Main pulmonary artery is normal in caliber. MEDIASTINUM: The heart and pericardium demonstrate no acute abnormality. There is no acute abnormality of the thoracic aorta. LYMPH NODES: No mediastinal, hilar or axillary lymphadenopathy. LUNGS AND PLEURA: The lungs are without acute process. No focal consolidation or pulmonary edema. No evidence of pleural effusion or pneumothorax. UPPER ABDOMEN: Limited images of the upper abdomen are unremarkable. SOFT TISSUES AND BONES: No acute bone or soft tissue abnormality. IMPRESSION: 1. No pulmonary embolism or acute pulmonary abnormality. Electronically signed by: Franky Crease MD 10/23/2024 02:15 AM EST RP Workstation: [id number]   DG Chest 2 View Result Date: 10/22/2024 EXAM: 2 VIEW(S) XRAY OF THE CHEST 10/22/2024 09:57:00 PM COMPARISON: None available. CLINICAL HISTORY: cp FINDINGS: LUNGS AND PLEURA: No focal pulmonary opacity. No pleural effusion. No pneumothorax. HEART AND MEDIASTINUM: No acute abnormality of  the cardiac and mediastinal silhouettes. BONES AND SOFT TISSUES: No acute osseous abnormality. IMPRESSION: 1. No acute process. Electronically signed by: Elsie Gravely MD 10/22/2024 10:01 PM EST RP Workstation: [id number]    Procedures   Medications Ordered in the ED  acetaminophen  (TYLENOL ) tablet 650 mg (650 mg Oral Given 10/23/24 0130)  iohexol  (OMNIPAQUE ) 350 MG/ML injection 75 mL (75 mLs Intravenous Contrast Given 10/23/24 0204)  ketorolac  (TORADOL ) 15 MG/ML injection 15 mg (15 mg Intravenous Given 10/23/24 0837)  dexamethasone  (DECADRON ) injection 10 mg (10 mg Intravenous Given 10/23/24 0837)  albuterol  (VENTOLIN  HFA) 108 (90 Base) MCG/ACT inhaler 2 puff (2 puffs Inhalation Given 10/23/24 0839)  AeroChamber Plus Flo-Vu Medium MISC 1 each (1 each Other Given 10/23/24 0901)                                Medical Decision Making Amount and/or Complexity of Data Reviewed Labs: ordered. Radiology: ordered.  Risk Prescription drug  management.   39 y.o. female presents to the ER for evaluation of chest pain. Differential diagnosis includes but is not limited to ACS, pericarditis, myocarditis, aortic dissection, PE, pneumothorax, esophageal rupture, pneumonia, reflux/PUD, biliary disease, pancreatitis, costochondritis, anxiety. Vital signs mild hypertension and mild tachycardia.  Tachycardia appears to be chronic from patient's from looking at previous visits. Physical exam as noted above.   Workup initiated in triage.   I independently reviewed and interpreted the patient's labs.  hCG negative.  CBC shows slight leukocytosis at 13.2.  Hemoglobin 11.0.  Platelet 474.  BMP without electrolyte abnormality.  Troponin at less than 15 with repeat at less than 15.  Chest x-ray 1. No acute process. Per radiologist's interpretation.    CTA of the chest  1. No pulmonary embolism or acute pulmonary abnormality. Per radiologist's interpretation.  EKG reviewed and interpreted by an attending and read as  Sinus tachycardia Rightward axis T wave abnormality, consider inferior ischemia Abnormal ECG No previous ECGs available.   Patient does not appear in any acute distress.  Pain is reproducible upon palpation.  Likely think she is having some rib muscle strain/sprain/some costochondritis from her recent flulike illness with coughing.  She has an occasional cough now.  Lung sounds mildly diminished however could be secondary to body habitus.  She does not appear in any acute respiratory distress and speaks in full sentences with ease.  Does have some mild tachycardia however from previous chart review appears around her baseline.  Her CT imaging unremarkable.  She does have mild leukocytosis however from reading her previous [facility] notes, she has been seen by heme-onc for slightly elevated leukocytosis.  Think this is likely more MSK in nature.  Will give her Toradol , Decadron  and will order her albuterol  inhaler for bronchospasm for her cough.  On reevaluation, patient reports that she is feeling much better after the medications.  Encouraged her to pick up some lidocaine patches as well to use these to her anterior ribs.  Will send her home with some prednisone  that should help with the cough as well as the inflammation.  I have sent her in some narcotic pain medication take for breakthrough pain as well.  Workup was reassuring, she stable for discharge home with strict return precautions.  We discussed the results of the labs/imaging. The plan is supportive care, take medications, follow-up PCP. We discussed strict return precautions and red flag symptoms. The patient verbalized their understanding and agrees to the plan. The patient is stable and being discharged home in good condition.  Portions of this report may have been transcribed using voice recognition software. Every effort was made to ensure accuracy; however, inadvertent computerized transcription errors may be present.    Final diagnoses:   Costochondritis    ED Discharge Orders          Ordered    predniSONE  (DELTASONE ) 20 MG tablet  Daily        10/23/24 0902    oxyCODONE  (ROXICODONE ) 5 MG immediate release tablet  Every 4 hours PRN        10/23/24 0903               Bernis Ernst, PA-C 10/23/24 9081  "

## 2024-10-23 NOTE — Discharge Instructions (Addendum)
 You were seen in the ER today for evaluation of your chest pain.  Thank you likely have something called costochondritis from her coughing from your recent flu.  For this, I am prescribing a medication called prednisone  which help with some of the inflammation.  For pain, take Tylenol  1000 mg every 6 hours as needed.  I have also prescribed you some narcotic pain medication that you take for breakthrough pain.  Please do not drive or operate any heavy machinery while on this medication as it will make you sleepy.  I have included some additional information into the discharge paperwork for you to review.  If you have any concerns, new or worsening symptoms, please return your nearest emergency ferment for reevaluation.  Otherwise, please follow with your primary care provider.  Contact a doctor if: You have chills or a fever. Your pain does not go away or gets worse. You have a cough that does not go away. Get help right away if: You have a hard time breathing. You have very bad chest pain that does not get better with medicines, heat, or ice. These symptoms may be an emergency. Get help right away. Call 911. Do not wait to see if the symptoms will go away. Do not drive yourself to the hospital.

## 2024-10-23 NOTE — ED Notes (Signed)
 Called PT three times for vitals.... no response

## 2024-10-23 NOTE — ED Provider Triage Note (Addendum)
 Emergency Medicine Provider Triage Evaluation Note  XIARA KNISLEY , a 39 y.o. female  was evaluated in triage.  Pt complains of LUQ abd pain that radiates into back started today with shob. Worsens with coughing, palpation.   Was recently diagnosed with flu with nonproductive cough. No NVD  Review of Systems  Positive: See hpi Negative:   Physical Exam  BP (!) 159/107 (BP Location: Right Arm)   Pulse (!) 117   Temp 98.6 F (37 C) (Oral)   Resp (!) 22   Ht 5' 4 (1.626 m)   Wt (!) 147.4 kg   LMP 09/26/2024 (Exact Date)   SpO2 94%   BMI 55.79 kg/m  Gen:   Awake, no distress   Resp:  Normal effort  MSK:   Moves extremities without difficulty  Other:  LUQ abd tenderness  Medical Decision Making  Medically screening exam initiated at 1:06 AM.  Appropriate orders placed.  EQUILLA QUE was informed that the remainder of the evaluation will be completed by another provider, this initial triage assessment does not replace that evaluation, and the importance of remaining in the ED until their evaluation is complete.  Cardiac workup reassuring. First trop neg. 2nd troponin pending. CXR wo abnormalities  Was initially tachycardic at 117bpm but decreased to 100bpm and 100% O2 on HPI. Temp 66F. No RF for PE. No recent travel, immobilization, sx, OCP. EKG notable for S1Q3T3, inverted T waves in III, AVF. No previous EKG to compare to. Did order CT PE study to ensure no PE post influenza   Minnie Tinnie BRAVO, PA 10/23/24 0113    Minnie Tinnie BRAVO, PA 10/23/24 [phone]

## 2024-10-30 ENCOUNTER — Other Ambulatory Visit: Payer: Self-pay | Admitting: Neurology

## 2024-10-31 ENCOUNTER — Other Ambulatory Visit: Payer: Self-pay

## 2024-10-31 MED ORDER — LEVETIRACETAM ER 500 MG PO TB24: 1000.0000 mg | ORAL_TABLET | Freq: Every day | ORAL | 3 refills | Status: AC

## 2024-12-04 ENCOUNTER — Ambulatory Visit: Admitting: Neurology
